# Patient Record
Sex: Female | Born: 2000 | Race: White | Hispanic: No | Marital: Single | Smoking: Never smoker
Health system: Southern US, Community
[De-identification: ages and names within clinical notes are randomized; demographics above are authoritative.]

## PROBLEM LIST (undated history)

## (undated) DIAGNOSIS — Z789 Other specified health status: Secondary | ICD-10-CM

---
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
## 2018-03-22 ENCOUNTER — Ambulatory Visit (INDEPENDENT_AMBULATORY_CARE_PROVIDER_SITE_OTHER): Payer: 59 | Admitting: Pediatrics

## 2018-03-22 ENCOUNTER — Encounter: Payer: Self-pay | Admitting: Family

## 2018-03-22 VITALS — BP 96/62 | HR 59 | Ht 65.35 in | Wt 132.4 lb

## 2018-03-22 DIAGNOSIS — Z113 Encounter for screening for infections with a predominantly sexual mode of transmission: Secondary | ICD-10-CM | POA: Diagnosis not present

## 2018-03-22 DIAGNOSIS — Z30011 Encounter for initial prescription of contraceptive pills: Secondary | ICD-10-CM | POA: Diagnosis not present

## 2018-03-22 DIAGNOSIS — Z3046 Encounter for surveillance of implantable subdermal contraceptive: Secondary | ICD-10-CM | POA: Diagnosis not present

## 2018-03-22 MED ORDER — NORETHIN ACE-ETH ESTRAD-FE 1.5-30 MG-MCG PO TABS: 1.0000 | ORAL_TABLET | Freq: Every day | ORAL | 11 refills | Status: AC

## 2018-03-22 NOTE — Progress Notes (Signed)
THIS RECORD MAY CONTAIN CONFIDENTIAL INFORMATION THAT SHOULD NOT BE RELEASED WITHOUT REVIEW OF THE SERVICE PROVIDER.  Adolescent Medicine Consultation Follow-Up Visit Ortha JAYDON MAROTTE  is a 18 y.o. female  here today for follow-up regarding nexplanon removal.    Phone: [phone]  Plan at last adolescent specialty clinic  visit included nexplanon removal.  Pertinent Labs? Yes Growth Chart Viewed? no   History was provided by the patient.  Interpreter? No  No chief complaint on file.   HPI:   PCP Confirmed?  No Dave is a 17 AFAB, IAF with history of depression not currently treated who presents for removal of Nexplanon and counseling for future birth control.  Telisha is not currently sexually active and is interested in taking a "break" from birth control.  She states her mom has encouraged her to be off birth control for a few months to have return of normal period.  Maddilyn feels she would be ok with being off [facility] for a month since she is currently not sexually active.  She is interested in switching to OCPs because her Nexplanon hurts her arm and she feels her acne is worse on it.  She has had no period on her nexplanon.   She has been sexually active in the past and has inconsistent condom use. We discussed the importance of condoms.     My Chart Activated?  Yes  No LMP recorded. No Known Allergies Current Outpatient Medications on File Prior to Visit  Medication Sig Dispense Refill  . etonogestrel (NEXPLANON) 68 MG IMPL implant 1 each by Subdermal route once. Right Arm     No current facility-administered medications on file prior to visit.     Patient Active Problem List   Diagnosis Date Noted  . Nexplanon in place 03/25/2015  . MDD (major depressive disorder), recurrent episode, moderate (HCC) 04/01/2013  . Suicide attempt by other psychotropic drug overdose (HCC) 04/01/2013  . GAD (generalized anxiety disorder) 04/01/2013    Social History: Changes with school  since last visit?  She is not currently in school.  Activities:  Special interests/hobbies/sports: paint   Lifestyle habits that can impact QOL: Sleep: Not great sleep chronically  Eating habits/patterns: Vegetarian 3 years ago, multivitamins  Water intake: 2-3 water bottles  Body Movement: Walking at work   Confidentiality was discussed with the patient and if applicable, with caregiver as well.  Changes at home or school since last visit:  Taking a gap year   Gender identity: Female  Sex assigned at birth: female  Pronouns: she  Tobacco? vaping on the weekends, 1 or 2,  Drugs/ETOH?  Weed everyday, alcohol on the weekend Partner preference?  female  Sexually Active? Yes,    Pregnancy Prevention:  condoms and implant Reviewed condoms: used condoms, not every time.  Reviewed EC: yes   Suicidal or homicidal thoughts?  Not recently Self injurious behaviors?   Guns in the home?  No     Physical Exam:  Vitals:   03/22/18 1451  BP: 96/62  Pulse: (!) 59  Weight: 132 lb 6.4 oz (60.1 kg)  Height: 5' 5.35" (1.66 m)   BP 96/62   Pulse (!) 59   Ht 5' 5.35" (1.66 m)   Wt 132 lb 6.4 oz (60.1 kg)   BMI 21.79 kg/m  Body mass index: body mass index is 21.79 kg/m. Blood pressure percentiles are not available for patients who are 18 years or older.  Physical Exam  General: Well developed, well nourished female  in no acute distress.  Appears  stated age Head: Normocephalic, atraumatic.   Eyes:  Pupils equal and round. EOMI.   Sclera white.  No eye drainage.   Ears/Nose/Mouth/Throat: Nares patent, no nasal drainage.  Normal dentition, mucous membranes moist.   Neck: supple, no cervical lymphadenopathy, no thyromegaly Cardiovascular: regular rate, normal S1/S2, no murmurs Respiratory: No increased work of breathing.  Lungs clear to auscultation bilaterally.  No wheezes. Abdomen: soft, nontender, nondistended. Normal bowel sounds.  No appreciable masses  Extremities: warm, well  perfused, cap refill < 2 sec.   Musculoskeletal: Normal muscle mass.  Normal strength Skin: warm, dry.  No rash or lesions. Neurologic: alert and oriented, normal speech, no tremor   Assessment/Plan: Nancy NordmannCydney is a 18 yo AFAB, IAF with history of depression who presents for nexplanon removal after 3 years of use.  Her nexplanon was removed today without any complication.  #Birth Control: - Nexplanon removed -Patient prefers to have return of menses before beginning new birth contol regimen  - Prescribed Junel and counseled that before starting, she should take a pregnancy test if she becomes sexually active.  #STI Screening: -GC/Chlamydia done today - HIV/ syphilis done today  Follow-up:  No follow-ups on file.   Medical decision-making:  >60 minutes spent face to face with patient with more than 50% of appointment spent discussing diagnosis, management, follow-up, and reviewing of birth control methods.  Rosalita LevanEllen Jaziah Goeller, MS4

## 2018-03-22 NOTE — Progress Notes (Signed)
Risks & benefits of Nexplanon removal discussed. Consent form signed.  The patient denies any allergies to anesthetics or antiseptics.  Procedure: Pt was placed in supine position. right arm was flexed at the elbow and externally rotated so that her wrist was parallel to her ear, The device was palpated and marked. The site was cleaned with Betadine. The area surrounding the device was covered with a sterile drape. 1% lidocaine was injected just under the device. A scalpel was used to create a small incision. The device was pushed towards the incision. Fibrous tissue surrounding the device was gradually removed from the device. The device was removed and measured to ensure all 4 cm of device was removed. Steri-strips were used to close the incision. Pressure dressing was applied to the patient.  The patient was instructed to removed the pressure dressing in 24 hrs.  The patient was advised to move slowly from a supine to an upright position  The patient denied any concerns or complaints  The patient was instructed to schedule a follow-up appt in 1 month. The patient will be called in 1 week to address any concerns. 

## 2018-03-22 NOTE — Patient Instructions (Addendum)
Emergency contraception available at clinic always- just call  Start birth control pill if you would like- please make sure you take a pregnancy test first if sexually active   Your Nexplanon was removed today and is no longer preventing pregnancy.  If you have sex, remember to use condoms to prevent pregnancy and to prevent sexually transmitted infections.  Leave the outside bandage on for 24 hours.  Leave the smaller bandages on for 3-5 days or until they fall off on their own.  Keep the area clean and dry for 3-5 days.  There is usually bruising or swelling at and around the removal site for a few days to a week after the removal.  If you see redness or pus draining from the removal site, call us immediately.  We would like you to return to the clinic for a follow-up visit in 1 month.  You can call [facility] 24 hours a day with any questions or concerns.  There is always a nurse or doctor available to take your call.  Call 9-1-1 if you have a life-threatening emergency.  For anything else, please call us at [phone] before heading to the ER.

## 2018-03-23 LAB — RPR: RPR Ser Ql: NONREACTIVE

## 2018-03-23 LAB — HIV ANTIBODY (ROUTINE TESTING W REFLEX): HIV 1&2 Ab, 4th Generation: NONREACTIVE

## 2018-03-24 LAB — C. TRACHOMATIS/N. GONORRHOEAE RNA
C. TRACHOMATIS RNA, TMA: NOT DETECTED
N. gonorrhoeae RNA, TMA: NOT DETECTED
